# Patient Record
Sex: Male | Born: 1960 | Hispanic: Refuse to answer | Marital: Single
Health system: Southern US, Community
[De-identification: ages and names within clinical notes are randomized; demographics above are authoritative.]

---

## 2006-09-29 ENCOUNTER — Emergency Department: Payer: Self-pay | Admitting: General Practice

## 2007-01-26 ENCOUNTER — Observation Stay: Payer: Self-pay | Admitting: Internal Medicine

## 2007-01-26 ENCOUNTER — Other Ambulatory Visit: Payer: Self-pay

## 2008-02-10 ENCOUNTER — Emergency Department: Payer: Self-pay | Admitting: Emergency Medicine

## 2008-08-05 ENCOUNTER — Inpatient Hospital Stay: Payer: Self-pay | Admitting: Internal Medicine

## 2008-08-11 ENCOUNTER — Emergency Department: Payer: Self-pay | Admitting: Emergency Medicine

## 2008-11-14 ENCOUNTER — Emergency Department: Payer: Self-pay | Admitting: Emergency Medicine

## 2009-01-03 ENCOUNTER — Ambulatory Visit: Payer: Self-pay | Admitting: Gastroenterology

## 2009-01-10 ENCOUNTER — Ambulatory Visit: Payer: Self-pay | Admitting: Gastroenterology

## 2009-01-20 ENCOUNTER — Ambulatory Visit: Payer: Self-pay | Admitting: Gastroenterology

## 2009-01-28 ENCOUNTER — Ambulatory Visit: Payer: Self-pay | Admitting: Gastroenterology

## 2009-01-31 ENCOUNTER — Ambulatory Visit: Payer: Self-pay | Admitting: Gastroenterology

## 2009-02-11 ENCOUNTER — Ambulatory Visit: Payer: Self-pay | Admitting: Gastroenterology

## 2009-02-18 ENCOUNTER — Ambulatory Visit: Payer: Self-pay | Admitting: Gastroenterology

## 2009-03-04 ENCOUNTER — Ambulatory Visit: Payer: Self-pay | Admitting: Gastroenterology

## 2009-03-18 ENCOUNTER — Ambulatory Visit: Payer: Self-pay | Admitting: Gastroenterology

## 2009-03-25 ENCOUNTER — Ambulatory Visit: Payer: Self-pay | Admitting: Gastroenterology

## 2010-01-21 ENCOUNTER — Emergency Department: Payer: Self-pay | Admitting: Emergency Medicine

## 2010-04-01 ENCOUNTER — Emergency Department: Payer: Self-pay | Admitting: Emergency Medicine

## 2010-09-10 ENCOUNTER — Emergency Department: Payer: Self-pay | Admitting: Emergency Medicine

## 2011-08-13 ENCOUNTER — Emergency Department: Payer: Self-pay | Admitting: Unknown Physician Specialty

## 2011-08-13 LAB — COMPREHENSIVE METABOLIC PANEL
Alkaline Phosphatase: 50 U/L (ref 50–136)
Calcium, Total: 8.8 mg/dL (ref 8.5–10.1)
Chloride: 109 mmol/L — ABNORMAL HIGH (ref 98–107)
EGFR (Non-African Amer.): >60
SGOT(AST): 26 U/L (ref 15–37)
Total Protein: 7.3 g/dL (ref 6.4–8.2)

## 2011-08-13 LAB — TROPONIN I: Troponin-I: <0.02 ng/mL

## 2011-08-13 LAB — CBC
MCH: 29.9 pg (ref 26.0–34.0)
MCV: 89 fL (ref 80–100)
RDW: 13.6 % (ref 11.5–14.5)
WBC: 10.1 10*3/uL (ref 3.8–10.6)

## 2011-08-13 LAB — CK TOTAL AND CKMB (NOT AT ARMC): CK, Total: 150 U/L (ref 35–232)

## 2011-09-19 ENCOUNTER — Emergency Department: Payer: Self-pay | Admitting: Emergency Medicine

## 2011-09-19 LAB — CBC
HCT: 56 % — ABNORMAL HIGH (ref 40.0–52.0)
MCV: 89 fL (ref 80–100)
Platelet: 268 10*3/uL (ref 150–440)
RBC: 6.29 10*6/uL — ABNORMAL HIGH (ref 4.40–5.90)
RDW: 13.8 % (ref 11.5–14.5)

## 2011-09-19 LAB — LIPASE, BLOOD: Lipase: 172 U/L (ref 73–393)

## 2011-09-19 LAB — URINALYSIS, COMPLETE
Glucose,UR: NEGATIVE mg/dL (ref 0–75)
Leukocyte Esterase: NEGATIVE
Nitrite: NEGATIVE
Protein: NEGATIVE
RBC,UR: 2 /HPF (ref 0–5)
Specific Gravity: 1.021 (ref 1.003–1.030)
Squamous Epithelial: NONE SEEN
WBC UR: 1 /HPF (ref 0–5)

## 2011-09-19 LAB — CLOSTRIDIUM DIFFICILE BY PCR

## 2011-09-19 LAB — COMPREHENSIVE METABOLIC PANEL
BUN: 12 mg/dL (ref 7–18)
Calcium, Total: 9.4 mg/dL (ref 8.5–10.1)
Chloride: 106 mmol/L (ref 98–107)
Creatinine: 0.87 mg/dL (ref 0.60–1.30)
SGOT(AST): 28 U/L (ref 15–37)
Total Protein: 8.7 g/dL — ABNORMAL HIGH (ref 6.4–8.2)

## 2011-09-21 LAB — STOOL CULTURE

## 2011-10-03 ENCOUNTER — Emergency Department: Payer: Self-pay | Admitting: Internal Medicine

## 2011-10-03 LAB — COMPREHENSIVE METABOLIC PANEL
BUN: 14 mg/dL (ref 7–18)
Bilirubin,Total: 0.5 mg/dL (ref 0.2–1.0)
Chloride: 102 mmol/L (ref 98–107)
EGFR (African American): >60
EGFR (Non-African Amer.): >60
Osmolality: 276 (ref 275–301)
SGPT (ALT): 38 U/L
Total Protein: 7.8 g/dL (ref 6.4–8.2)

## 2011-10-03 LAB — CBC
HCT: 49 % (ref 40.0–52.0)
MCHC: 33.8 g/dL (ref 32.0–36.0)
MCV: 89 fL (ref 80–100)
Platelet: 192 10*3/uL (ref 150–440)
RBC: 5.51 10*6/uL (ref 4.40–5.90)
RDW: 13.9 % (ref 11.5–14.5)

## 2011-10-08 LAB — CULTURE, BLOOD (SINGLE)

## 2012-02-10 ENCOUNTER — Emergency Department: Payer: Self-pay | Admitting: Emergency Medicine

## 2012-02-11 LAB — COMPREHENSIVE METABOLIC PANEL
Alkaline Phosphatase: 64 U/L (ref 50–136)
Bilirubin,Total: 0.2 mg/dL (ref 0.2–1.0)
Co2: 26 mmol/L (ref 21–32)
Creatinine: 0.98 mg/dL (ref 0.60–1.30)
EGFR (Non-African Amer.): >60
Sodium: 140 mmol/L (ref 136–145)
Total Protein: 7.7 g/dL (ref 6.4–8.2)

## 2012-02-11 LAB — CBC
HCT: 46.8 % (ref 40.0–52.0)
MCV: 87 fL (ref 80–100)
RBC: 5.39 10*6/uL (ref 4.40–5.90)
WBC: 8.4 10*3/uL (ref 3.8–10.6)

## 2012-09-20 LAB — URINALYSIS, COMPLETE
Ketone: NEGATIVE
Leukocyte Esterase: NEGATIVE
Ph: 5 (ref 4.5–8.0)
Protein: NEGATIVE
RBC,UR: 2 /HPF (ref 0–5)
Specific Gravity: 1.026 (ref 1.003–1.030)
WBC UR: <1 /HPF (ref 0–5)

## 2012-09-20 LAB — COMPREHENSIVE METABOLIC PANEL
Albumin: 4.4 g/dL (ref 3.4–5.0)
Alkaline Phosphatase: 78 U/L (ref 50–136)
BUN: 12 mg/dL (ref 7–18)
Bilirubin,Total: 0.6 mg/dL (ref 0.2–1.0)
Calcium, Total: 9.5 mg/dL (ref 8.5–10.1)
Chloride: 107 mmol/L (ref 98–107)
Co2: 24 mmol/L (ref 21–32)
Glucose: 103 mg/dL — ABNORMAL HIGH (ref 65–99)
Osmolality: 274 (ref 275–301)
SGOT(AST): 25 U/L (ref 15–37)
SGPT (ALT): 32 U/L (ref 12–78)

## 2012-09-20 LAB — CBC
HGB: 18 g/dL (ref 13.0–18.0)
MCH: 29.1 pg (ref 26.0–34.0)
RDW: 13.9 % (ref 11.5–14.5)
WBC: 11.2 10*3/uL — ABNORMAL HIGH (ref 3.8–10.6)

## 2012-09-20 LAB — TSH: Thyroid Stimulating Horm: 1.45 u[IU]/mL

## 2012-09-20 LAB — DRUG SCREEN, URINE
Barbiturates, Ur Screen: NEGATIVE (ref ?–200)
Cocaine Metabolite,Ur ~~LOC~~: NEGATIVE (ref ?–300)
Opiate, Ur Screen: NEGATIVE (ref ?–300)
Phencyclidine (PCP) Ur S: NEGATIVE (ref ?–25)
Tricyclic, Ur Screen: NEGATIVE (ref ?–1000)

## 2012-09-20 LAB — ETHANOL: Ethanol: <3 mg/dL

## 2012-09-21 ENCOUNTER — Inpatient Hospital Stay: Payer: Self-pay | Admitting: Psychiatry

## 2012-09-22 LAB — URINALYSIS, COMPLETE
Bacteria: NONE SEEN
Bilirubin,UR: NEGATIVE
Blood: NEGATIVE
Glucose,UR: NEGATIVE mg/dL (ref 0–75)
Hyaline Cast: 1
Ketone: NEGATIVE
Leukocyte Esterase: NEGATIVE
Nitrite: NEGATIVE
Ph: 5 (ref 4.5–8.0)
Protein: NEGATIVE
RBC,UR: <1 /HPF (ref 0–5)
Specific Gravity: 1.016 (ref 1.003–1.030)
Squamous Epithelial: NONE SEEN
WBC UR: <1 /HPF (ref 0–5)

## 2012-09-22 LAB — BEHAVIORAL MEDICINE 1 PANEL
Albumin: 3.7 g/dL (ref 3.4–5.0)
Alkaline Phosphatase: 58 U/L (ref 50–136)
Anion Gap: 9 (ref 7–16)
BUN: 13 mg/dL (ref 7–18)
Basophil #: 0.1 10*3/uL (ref 0.0–0.1)
Basophil %: 0.9 %
Bilirubin,Total: 0.6 mg/dL (ref 0.2–1.0)
Calcium, Total: 9.4 mg/dL (ref 8.5–10.1)
Chloride: 103 mmol/L (ref 98–107)
Co2: 24 mmol/L (ref 21–32)
Creatinine: 0.92 mg/dL (ref 0.60–1.30)
EGFR (African American): >60
EGFR (Non-African Amer.): >60
Eosinophil #: 0.2 10*3/uL (ref 0.0–0.7)
Eosinophil %: 2.6 %
Glucose: 127 mg/dL — ABNORMAL HIGH (ref 65–99)
HCT: 48.2 % (ref 40.0–52.0)
HGB: 16.5 g/dL (ref 13.0–18.0)
Lymphocyte #: 3.1 10*3/uL (ref 1.0–3.6)
Lymphocyte %: 40.9 %
MCH: 29.4 pg (ref 26.0–34.0)
MCHC: 34.3 g/dL (ref 32.0–36.0)
MCV: 86 fL (ref 80–100)
Monocyte #: 0.6 x10 3/mm (ref 0.2–1.0)
Monocyte %: 8.2 %
Neutrophil #: 3.6 10*3/uL (ref 1.4–6.5)
Neutrophil %: 47.4 %
Osmolality: 274 (ref 275–301)
Platelet: 215 10*3/uL (ref 150–440)
Potassium: 3.9 mmol/L (ref 3.5–5.1)
RBC: 5.63 10*6/uL (ref 4.40–5.90)
RDW: 14 % (ref 11.5–14.5)
SGOT(AST): 17 U/L (ref 15–37)
SGPT (ALT): 31 U/L (ref 12–78)
Sodium: 136 mmol/L (ref 136–145)
Thyroid Stimulating Horm: 2.11 u[IU]/mL
Total Protein: 7.4 g/dL (ref 6.4–8.2)
WBC: 7.6 10*3/uL (ref 3.8–10.6)

## 2013-10-28 IMAGING — CT CT MAXILLOFACIAL WITHOUT CONTRAST
1 series · 16 of 30 positions shown, 20 images · non-contrast
Comparison: none

REASON FOR EXAM: face pain and trauma
COMMENTS:

PROCEDURE:     CT  - CT MAXILLOFACIAL AREA WO  - September 20, 2012  [DATE]
RESULT:     Intraconal facial pain.

[Series 2: facial 3.0 h60f · axial · 0.33mm/px · z∈[+359,+506]mm · 16 of 53 slices shown, 20 images]
[im 2/53  brain]
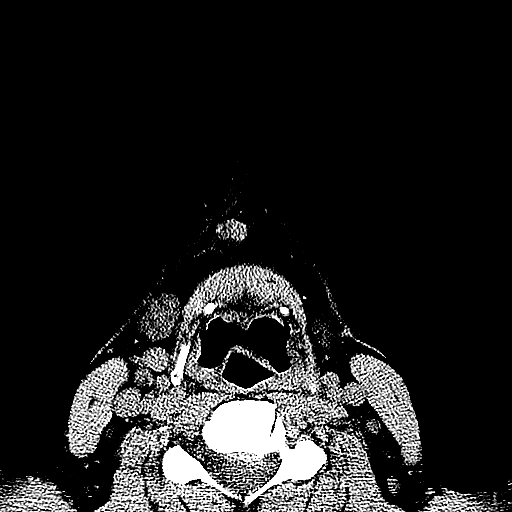
[im 2/53  bone]
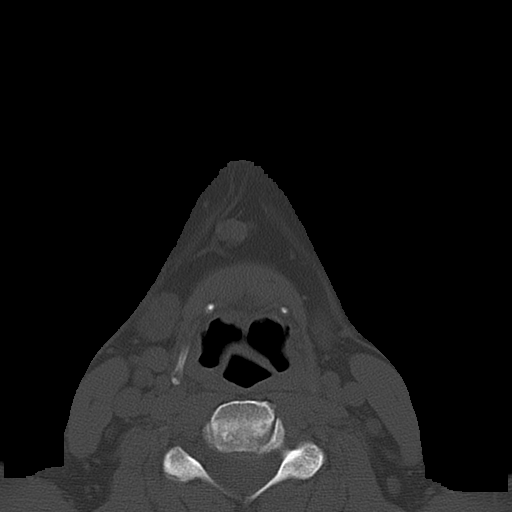
[im 6/53  bone]
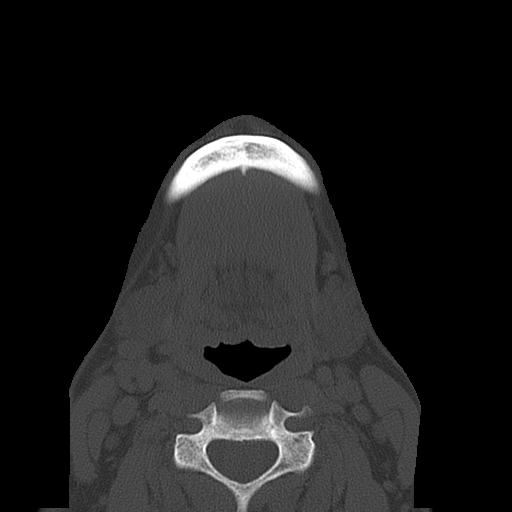
[im 9/53  bone]
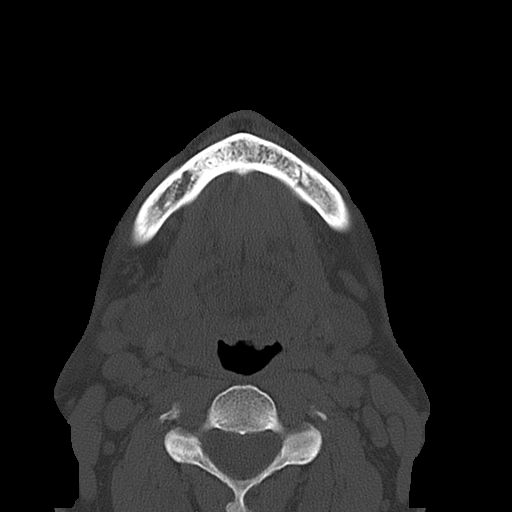
[im 13/53  bone]
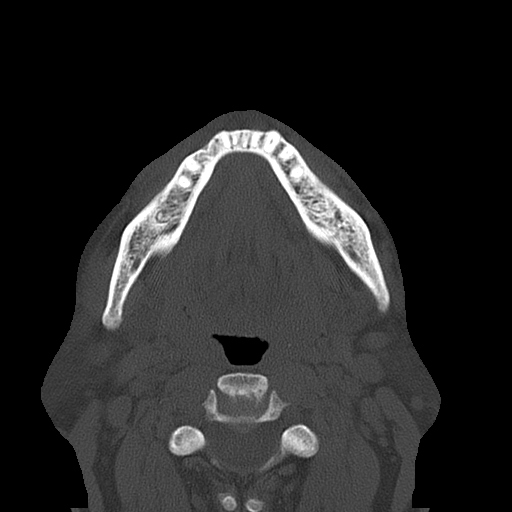
[im 15/53  brain]
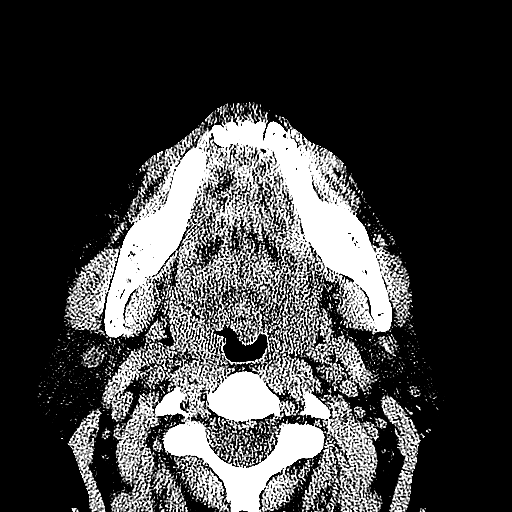
[im 15/53  bone]
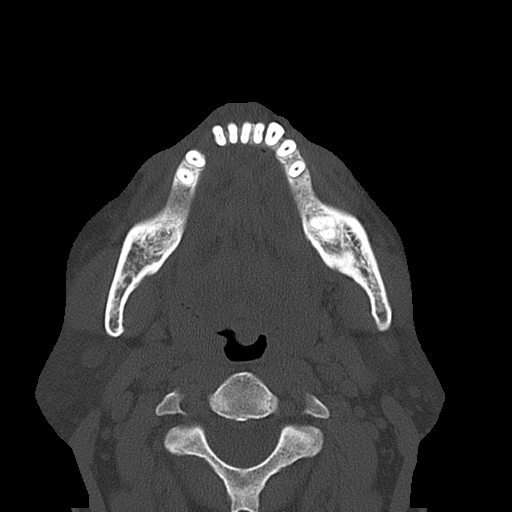
[im 18/53  bone]
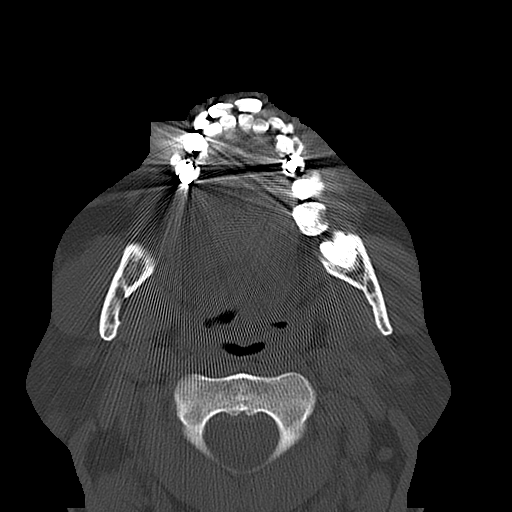
[im 22/53  bone]
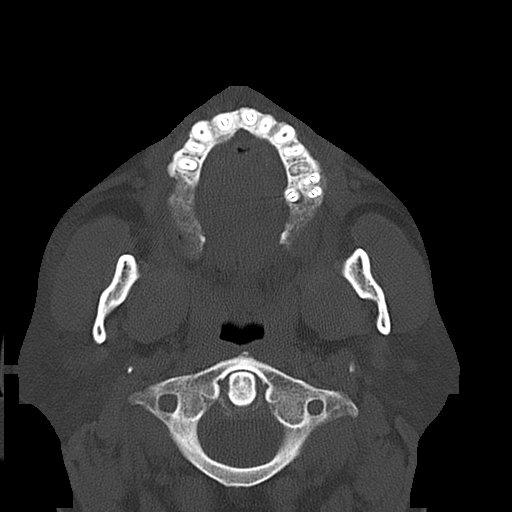
[im 26/53  bone]
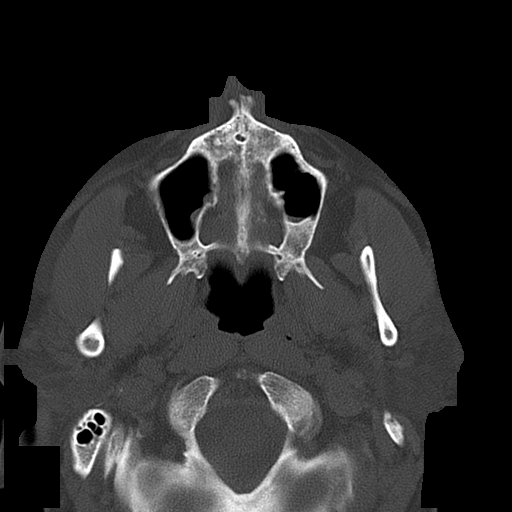
[im 27/53  brain]
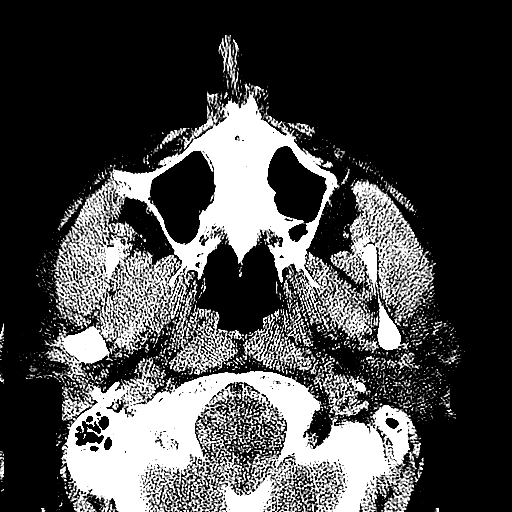
[im 27/53  bone]
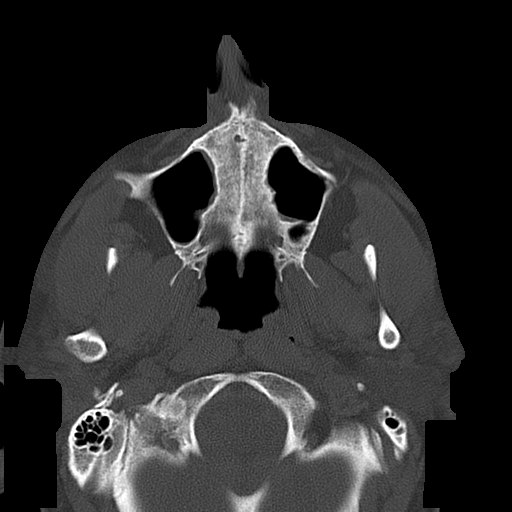
[im 31/53  bone]
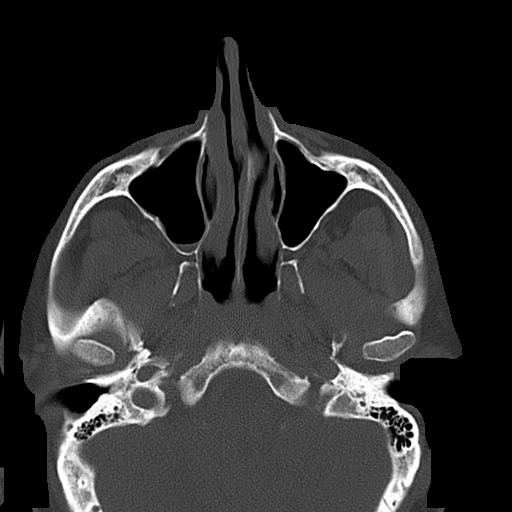
[im 35/53  bone]
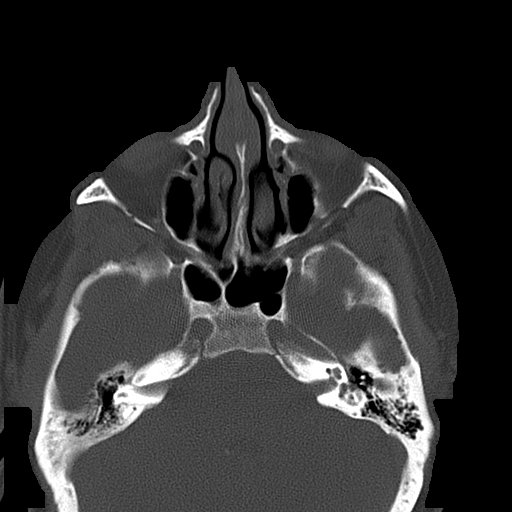
[im 38/53  bone]
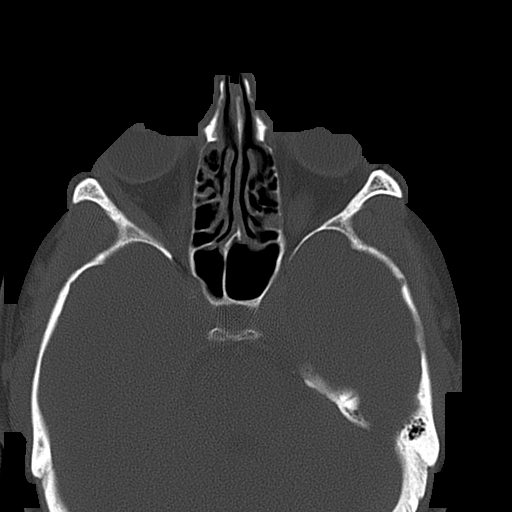
[im 40/53  brain]
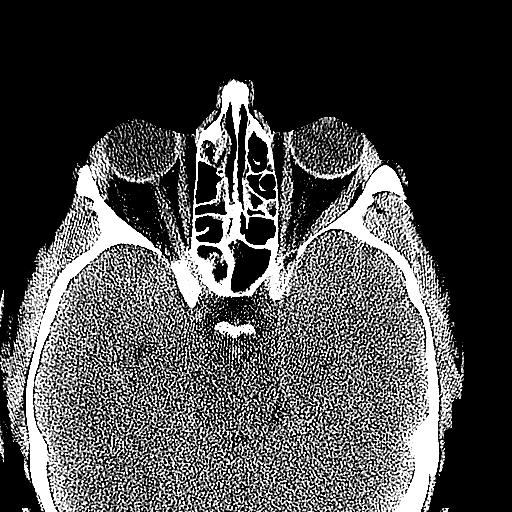
[im 40/53  bone]
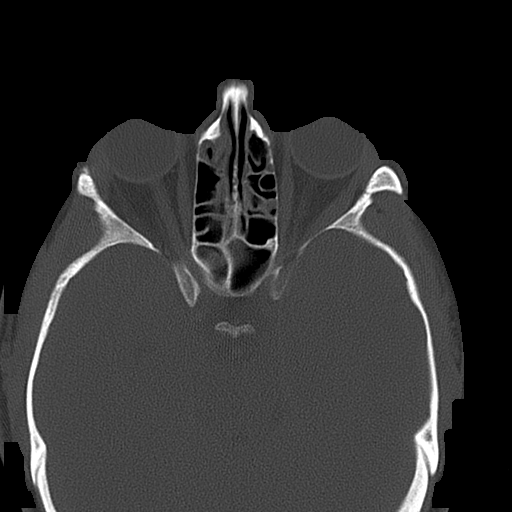
[im 44/53  bone]
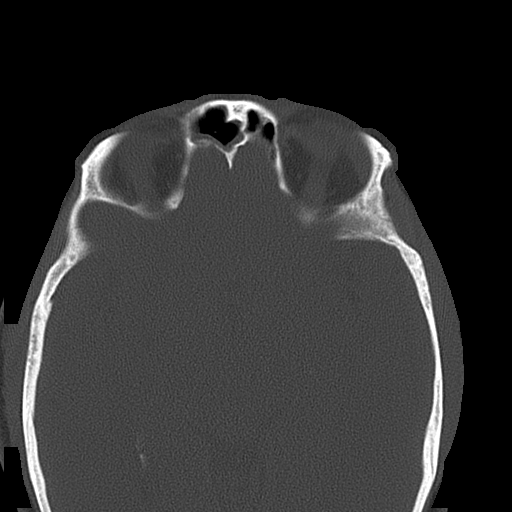
[im 47/53  bone]
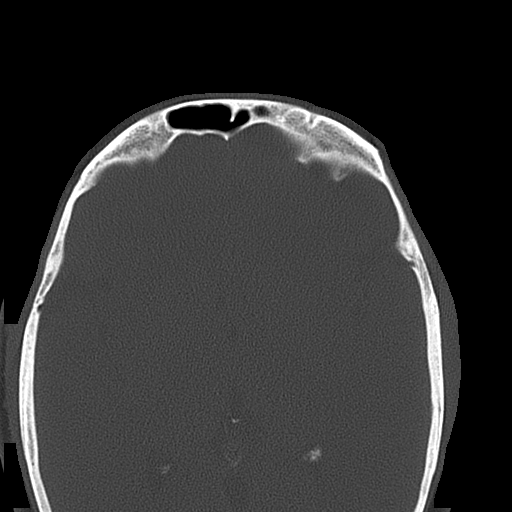
[im 51/53  bone]
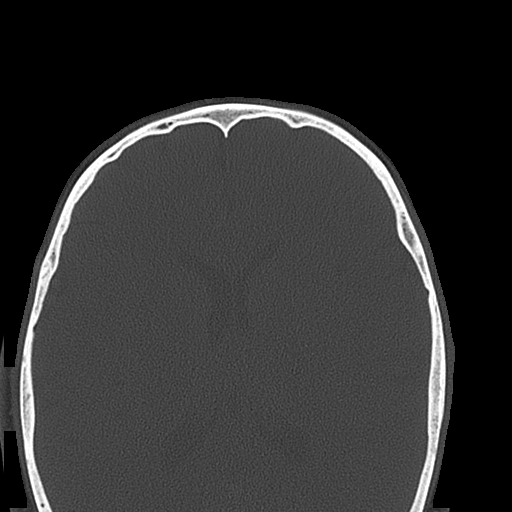

[16 of 30 positions shown; findings below may reference images not displayed]

FINDINGS: Standard nonenhanced CT of the face obtained. Mild mucosal
thickening noted of the ethmoid sinuses and maxillary sinuses. Mild mucosal
thickening of the sphenoid sinus. Nasopharynx and oropharynx appear normal.
Tongue base appears normal. Visualized salivary glands appear normal.
Multiple prominent cervical lymph nodes are present. These could be benign
and inflammatory or malignant. Submental lymph nodes are present. Small
submandibular lymph nodes are present. Orbits are normal. No acute bony
abnormality. External auditory canals patent. IACs and skull base normal.
IMPRESSION: 1. Mild sinus disease.
2. Cervical, submental, submandibular  adenopathy.
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
## 2014-08-02 NOTE — Consult Note (Signed)
PATIENT NAME:  Aaron Nixon, Aaron Nixon MR#:  [id number] DATE OF BIRTH:  12/31/1960  DATE OF CONSULTATION:  09/21/2012  REFERRING PHYSICIAN:  Maricela BoLuna Ragsdale, MD CONSULTING PHYSICIAN:  Ardeen FillersUzma S. Garnetta BuddyFaheem, MD  REASON FOR CONSULTATION: Suicidal with a plan to use carbon monoxide to kill himself.   HISTORY OF PRESENT ILLNESS: The patient is a 54 year old divorced male who presented to the Emergency Room requesting help for suicidal ideation. He reported that he has a long history of depression, which has been untreated, and he has been feeling very depressed. He reported that he has been having suicidal thoughts for several weeks. He reported that he tried to hurt himself and was beating himself on his face and was punching his face, and his eyeballs appeared very red. He reported that he was also thinking about carbon monoxide poisoning. The patient reported that his girlfriend broke up with him last week, and they had a prior relationship for the past 4 years. He reported that the girlfriend gave him a deadline of 2 weeks that he should find a job, otherwise they will never be together. The patient reported that he was living with a roommate, but the roommate is now moving with the mother in a trailer home. He is not allowed to go with him. The patient reported that he is feeling very depressed and feels like "shit." He stated that he is hating himself at this time. The patient reported that all his family lives in [place]. His sister lives in [place], but he does not have any relationship with her. The patient reported that he has been unemployed and has been having financial difficulties. He wakes up sobbing, having poor appetite, poor concentration, anhedonia, feeling hopeless, helpless, unable to contract for safety at this time.   PAST PSYCHIATRIC HISTORY: The patient reported that he does not have any prior history of psychiatric illness, including inpatient or outpatient treatment. He has never taken any  psychotropic medications. He has never attempted suicide in the past.   PAST MEDICAL HISTORY: GERD.   ALLERGIES: He reported that he is scared of needles, but has no allergic reaction to the needles.   SOCIAL HISTORY: The patient reported that he has 2 daughters who live in [place], ages 6722 and 1319. His sister lives in [place]. He stated that he cannot go and live with her at this time. 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 Since then, he was unable to find any other job. He is desperately trying to find another job at this time. He denied any pending legal charges.   SUBSTANCE ABUSE HISTORY: The patient reported that he used marijuana 2 days ago as he was feeling very depressed and desperate. He stated that he does not like alcohol and does not drink on a regular basis. He denies using other illicit drugs.   REVIEW OF SYSTEMS:  CONSTITUTIONAL: Denies any fever or chills. No weight changes.  EYES: They appeared very red as the patient was beating up on himself.  RESPIRATORY: No shortness of breath or cough.  CARDIOVASCULAR: No chest pain or orthopnea.  GASTROINTESTINAL: Having some GERD symptoms.  GENITOURINARY: No incontinence or frequency.  ENDOCRINE: No heat or cold intolerance.  LYMPHATIC: No anemia or easy bruising.  INTEGUMENTARY: No acne or rash.  MUSCULOSKELETAL: No joint or muscle pains.   VITAL SIGNS: Temperature 98.1, pulse 64, respirations 18,  blood pressure 142/84.  LABORATORY DATA: Glucose 103, BUN 12, creatinine 0.81, sodium 137, potassium 3.7, chloride 107, bicarbonate 24, anion gap 6, osmolality 274, calcium 9.5. Blood alcohol less than 3. Protein 8.4, albumin 4.4, bilirubin 0.6, alkaline phosphatase 72, AST 25, ALT 32. TSH 1.45. Urine drug screen positive for cannabinoids. WBC 11.2, RBC 6.2, hemoglobin 18, MCV 86, RDW 13.9.   MENTAL STATUS EXAMINATION:  The patient is a moderately built male who was sad and tearful during the interview. He maintained poor eye contact and was crying during the interview. His speech was low in tone and volume. Mood was depressed. Affect was congruent. Thought process was circumstantial. Thought content was non-delusional. He was unable to contract for safety and was having suicidal ideations with a plan to overdose and use carbon monoxide.   DIAGNOSTIC IMPRESSION:  AXIS I: Major depressive disorder, recurrent, severe, without psychotic features. Cannabis abuse.  AXIS II: None.  AXIS III: Gastroesophageal reflux disease.  TREATMENT PLAN:  1. The patient is currently on involuntary commitment and will be admitted to the Inpatient Behavioral Health Unit.  2. He will be started on the medications to help with his depressive symptoms, including Prozac 20 mg p.o. q.a.m. and trazodone 100 mg p.o. q.h.s.  3. Treatment team to follow and adjust his medications.  4. Group and milieu therapy.   Thank you for allowing me to participate in the care of this patient.   ____________________________ Ardeen Fillers. Garnetta Buddy, MD usf:OSi D: 09/21/2012 13:34:47 ET T: 09/21/2012 14:43:23 ET JOB#: [id number]  cc: Ardeen Fillers. Garnetta Buddy, MD, <Dictator> Rhunette Croft MD ELECTRONICALLY SIGNED 09/28/2012 11:06
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
